# Patient Record
Sex: Male | Born: 1990 | Race: White | Hispanic: No | Marital: Single | Smoking: Never smoker
Health system: Southern US, Community
[De-identification: ages and names within clinical notes are randomized; demographics above are authoritative.]

---
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
## 2012-08-19 ENCOUNTER — Emergency Department: Payer: Self-pay | Admitting: Emergency Medicine

## 2014-10-19 IMAGING — CT CT OF THE RIGHT FOOT WITHOUT CONTRAST
2 series · 14 of 27 positions shown, 18 images · non-contrast
Comparison: none

REASON FOR EXAM: plain film abnormality in navicular region
COMMENTS:

[Series 3: axial · axial · 0.39mm/px · z∈[-1440,-1307]mm · 9 of 159 slices shown, 12 images]
[im 13/159  soft-tissue]
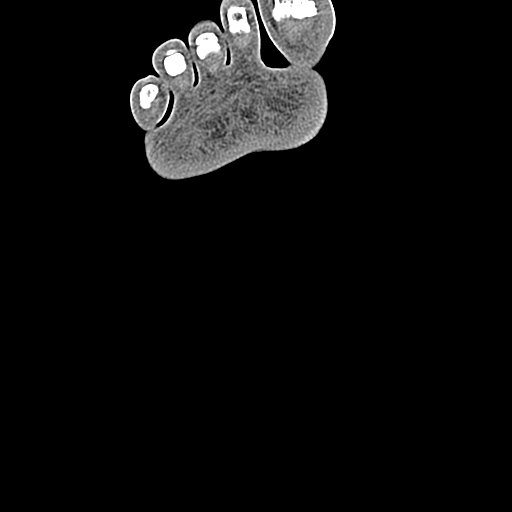
[im 13/159  bone]
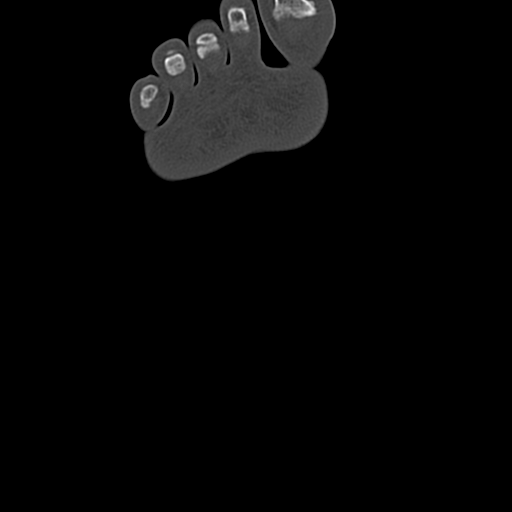
[im 37/159  bone]
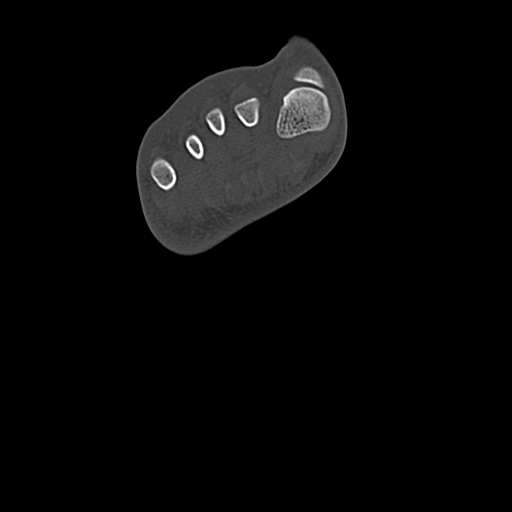
[im 49/159  bone]
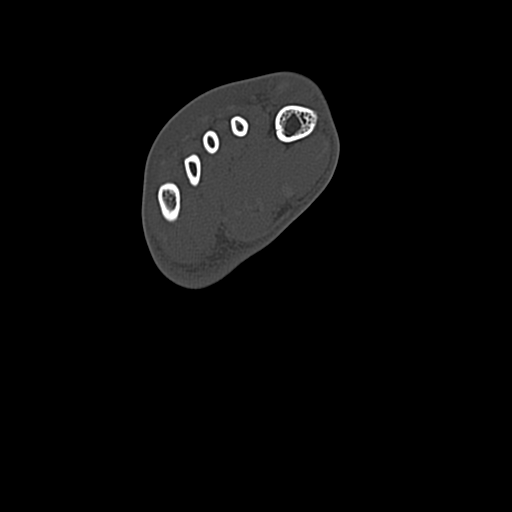
[im 61/159  bone]
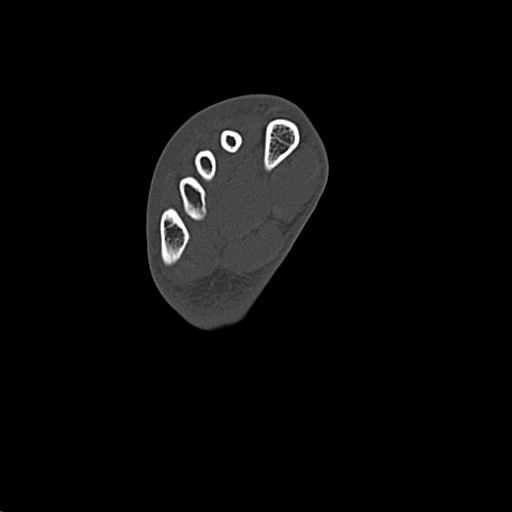
[im 86/159  soft-tissue]
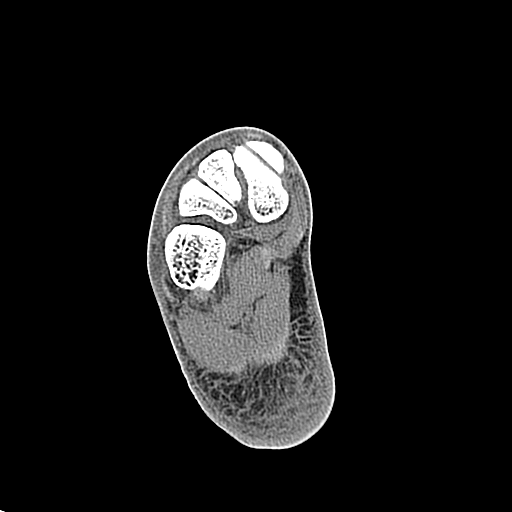
[im 86/159  bone]
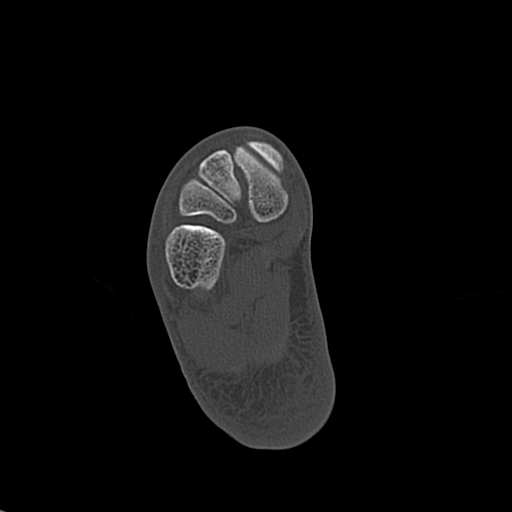
[im 98/159  bone]
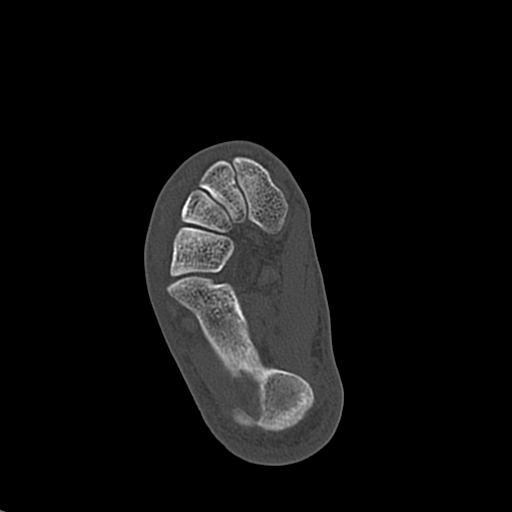
[im 110/159  bone]
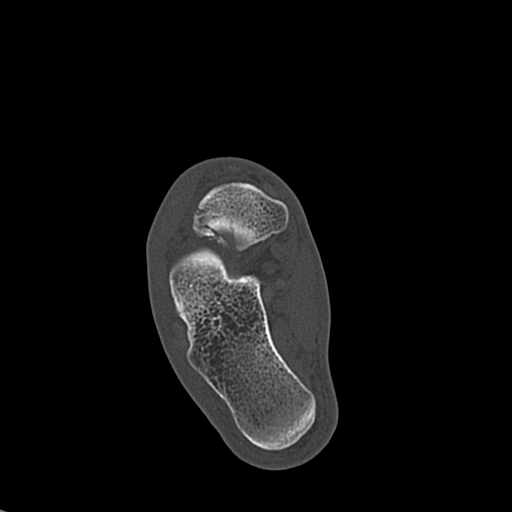
[im 134/159  bone]
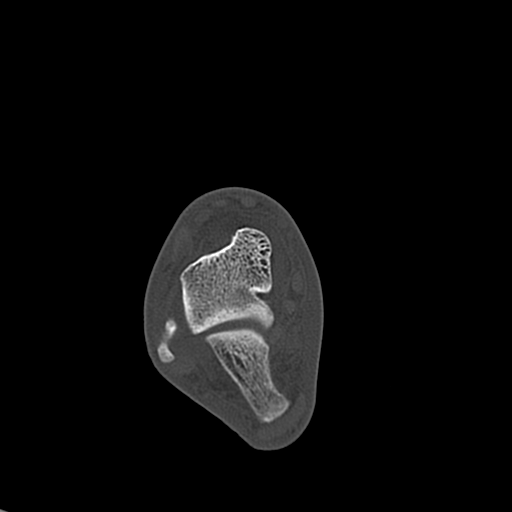
[im 146/159  soft-tissue]
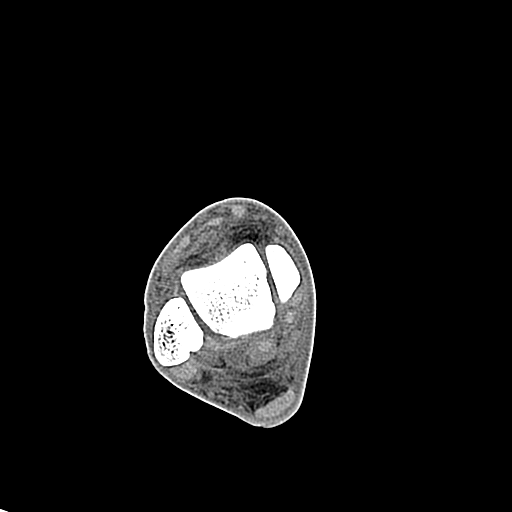
[im 146/159  bone]
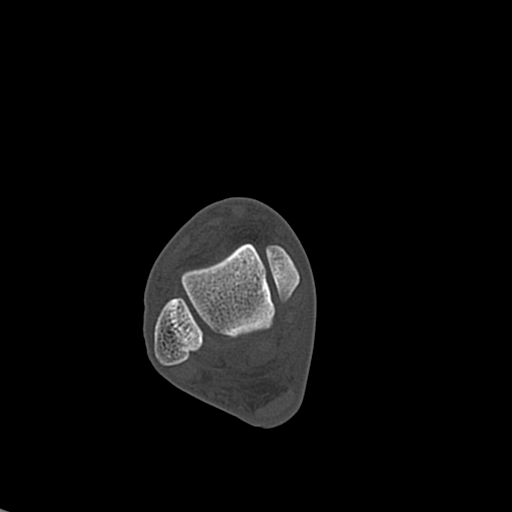

[Series 7: sagittal · sagittal · 0.40mm/px · 5 of 110 slices shown, 6 images]
[im 37/110  bone]
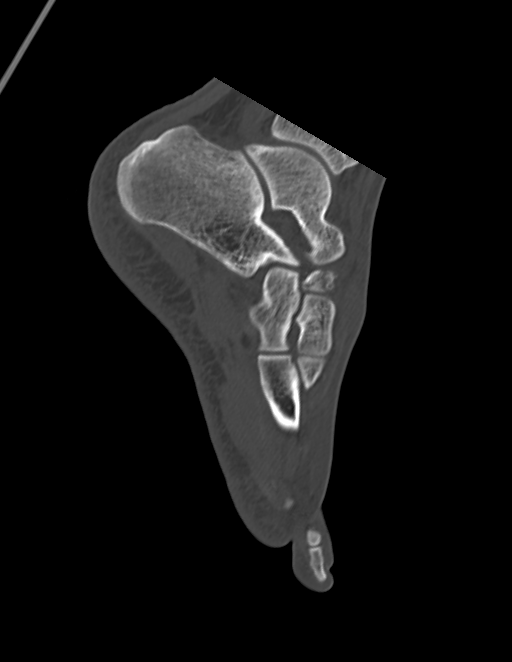
[im 46/110  bone]
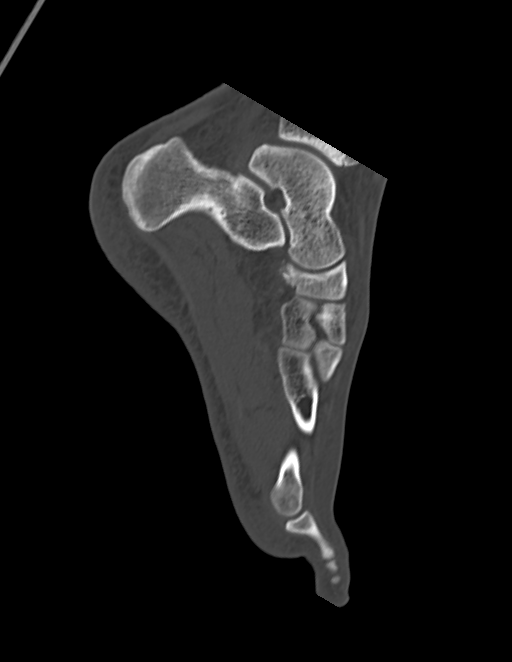
[im 55/110  soft-tissue]
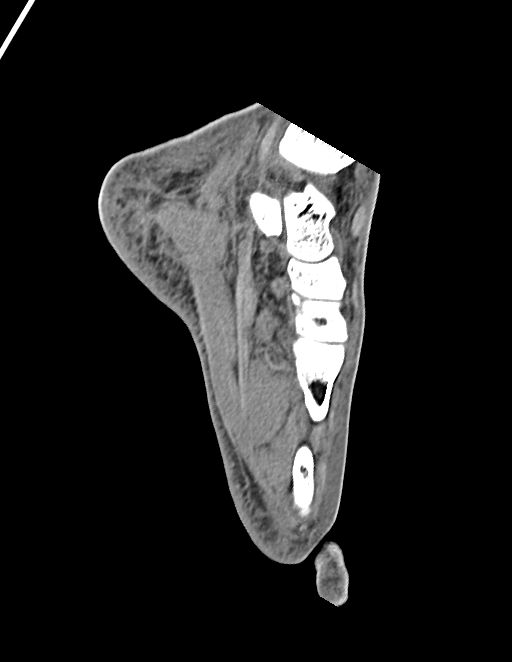
[im 55/110  bone]
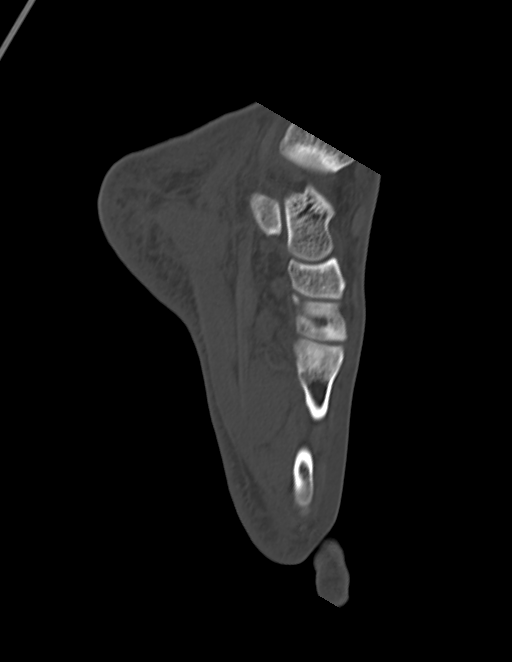
[im 64/110  bone]
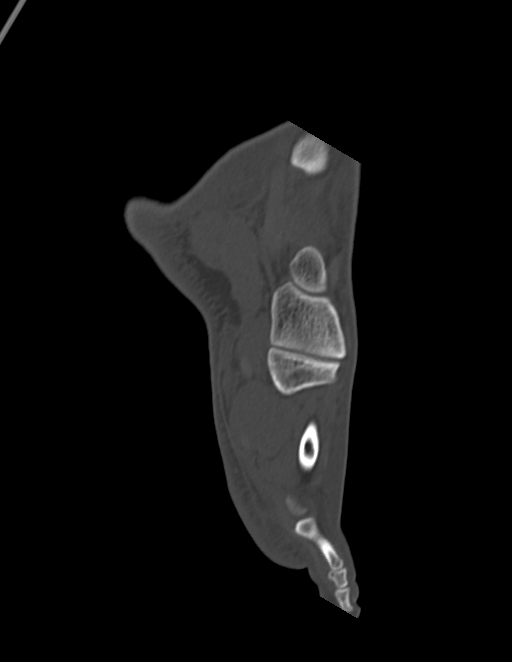
[im 73/110  bone]
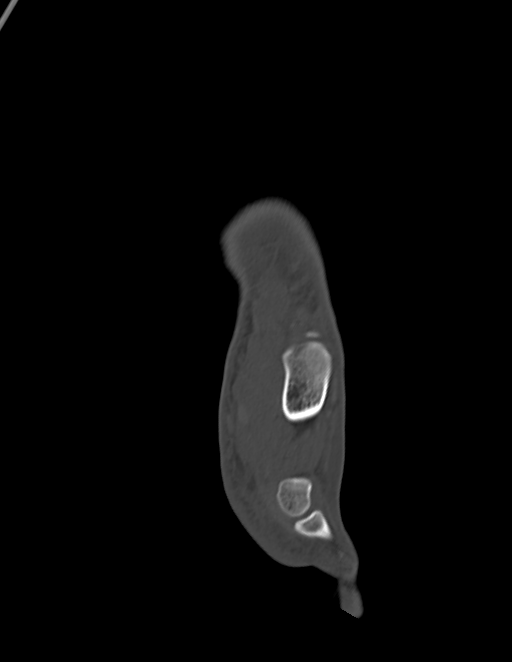

[14 of 27 positions shown; findings below may reference images not displayed]

PROCEDURE:     CT  - CT FOOT RIGHT WITHOUT CONTRAST  - August 19, 2012  [DATE]

RESULT:     Multislice helical acquisition through the right foot
demonstrates fracture in the tarsal navicular and with minimal distraction
at the fracture site. The fracture is present along the lateral aspect
extending to the talonavicular joint space dorsally and inferiorly. No other
fractures are appreciated.
IMPRESSION: 1. Fracture in the lateral aspect of the tarsal navicular. Orthopedic
followup or podiatry followup is recommended.

[REDACTED]

## 2014-10-19 IMAGING — CR RIGHT FOOT COMPLETE - 3+ VIEW
1 series · 3 of 3 positions shown · non-contrast
Comparison: none

REASON FOR EXAM: pain, swelling post trauma
COMMENTS:

[Series 1: x foot ap right · 0.14mm/px · 3 of 3 slices shown]
[im 1/3]
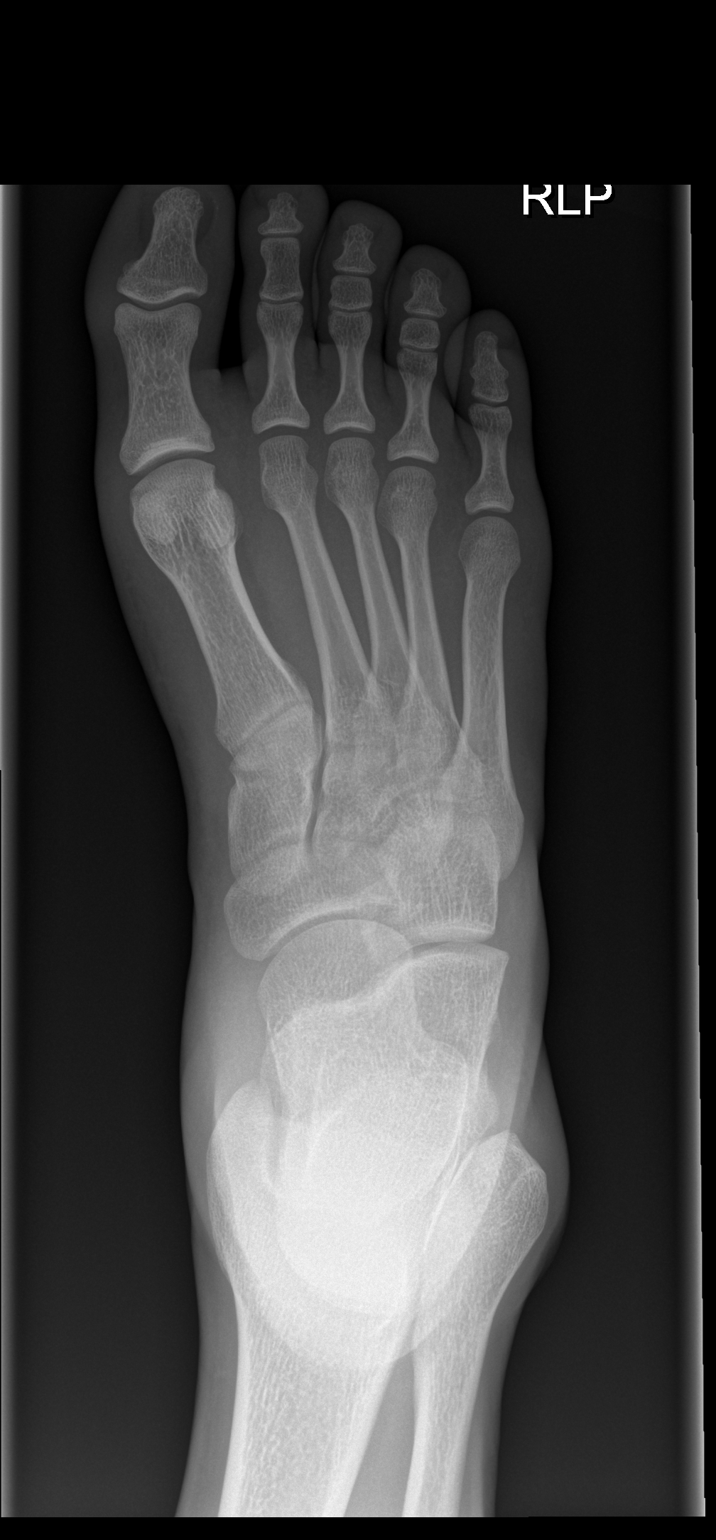
[im 2/3]
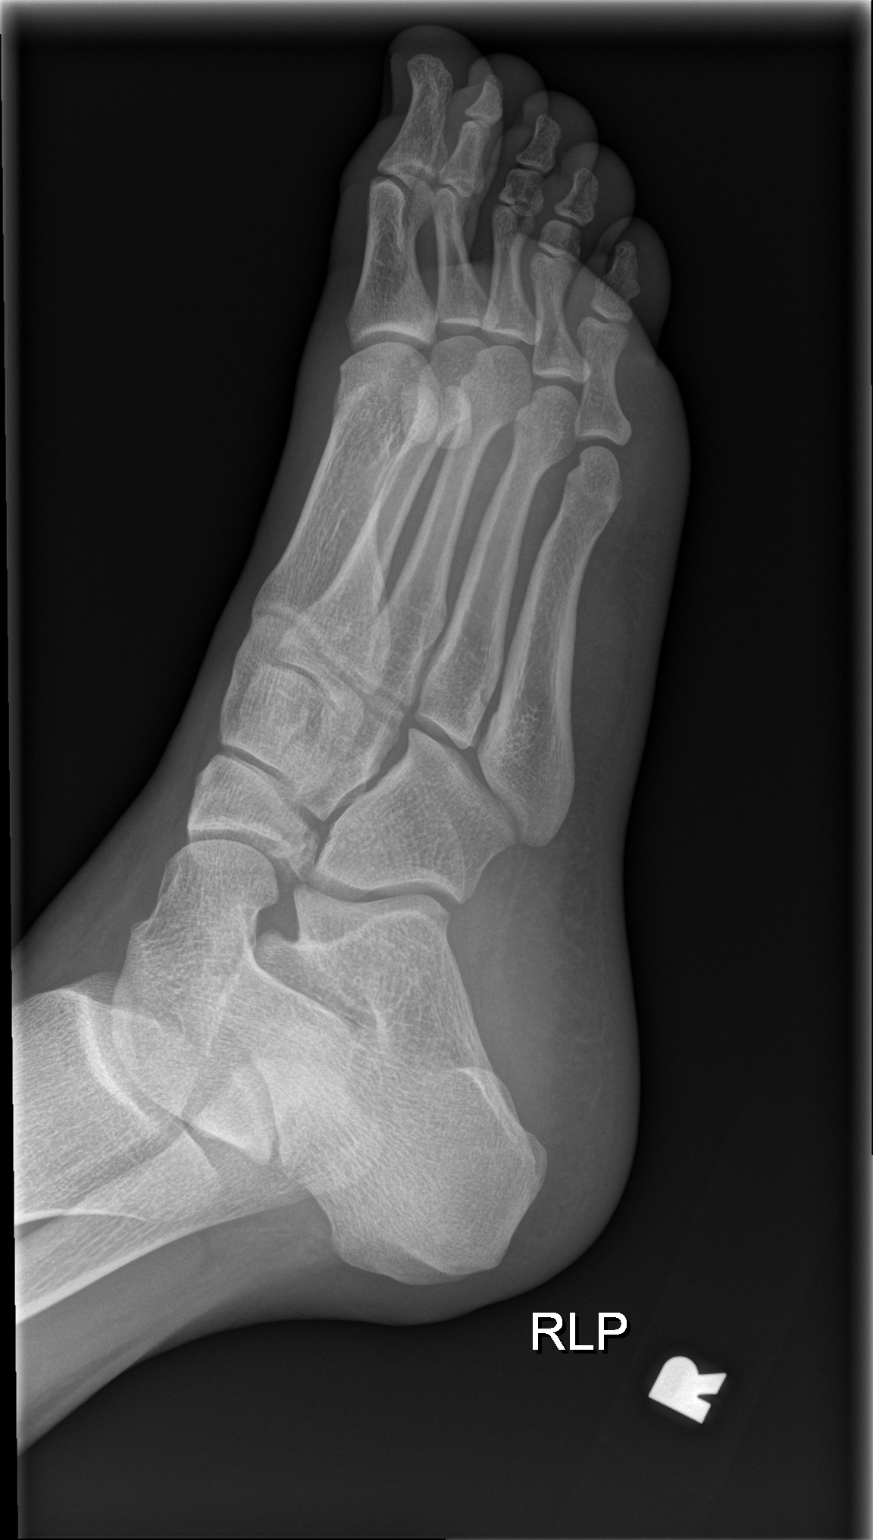
[im 3/3]
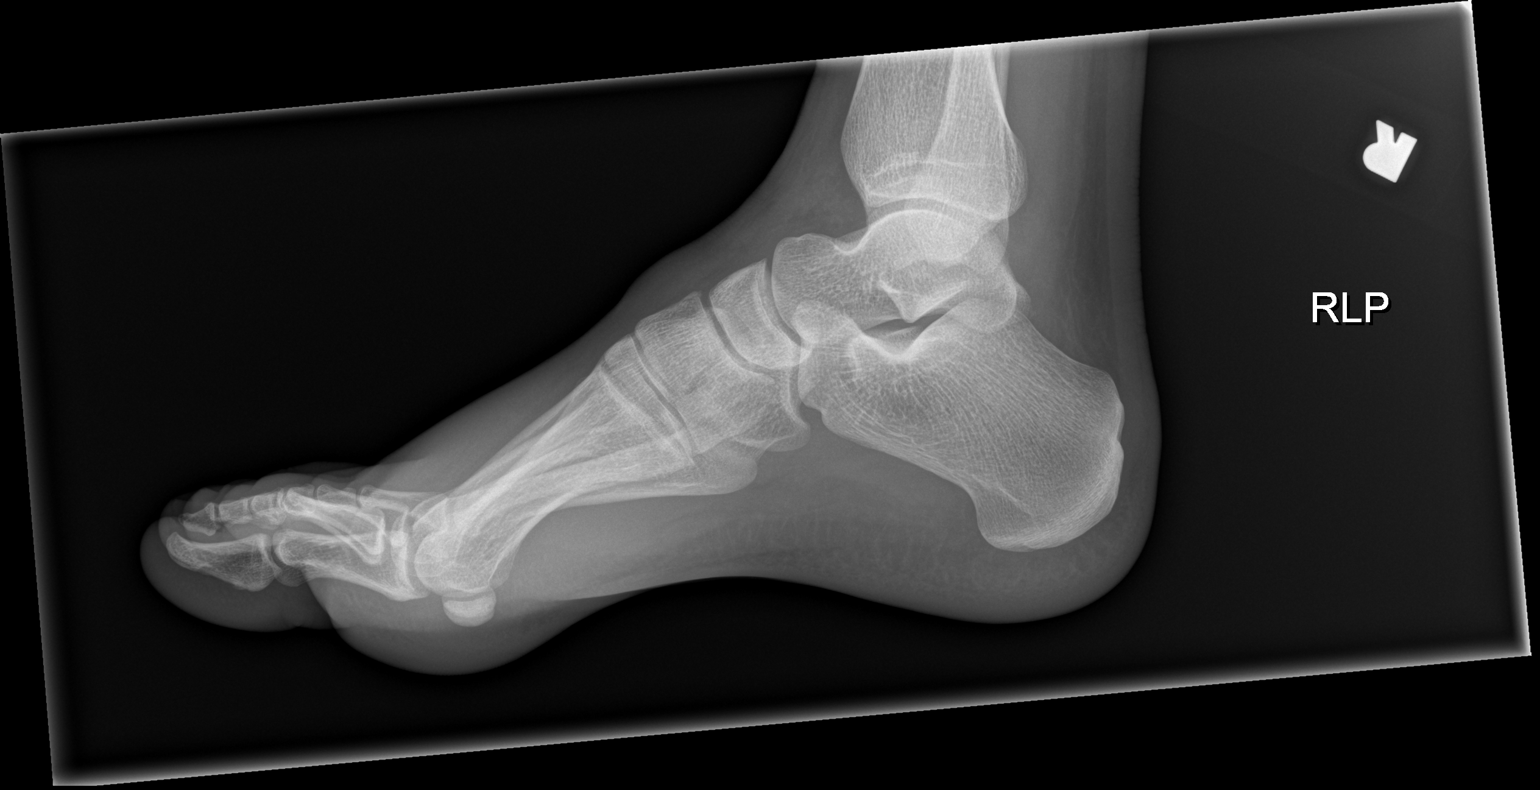

[3 of 3 positions shown; findings below may reference images not displayed]

PROCEDURE:     DXR - DXR FOOT RT COMPLETE W/OBLIQUES  - August 19, 2012 [DATE]

RESULT:     Three views of the right foot reveal the bones to be adequately
mineralized. There is no evidence of an acute phalangeal or metatarsal
fracture. The interphalangeal joints and the metatarsophalangeal joints are
normal in appearance. The metatarsal bases are intact. There is subtle
irregularity of the lateral pole of the tarsal navicular. This could reflect
an acute fracture in the appropriate clinical setting but correlation
clinically will be needed as to specific symptoms over the midfoot.
IMPRESSION: 1. There is no evidence of an acute metatarsal or phalangeal fracture.
2. There is cortical irregularity of the tarsal navicular laterally. It may
be useful to consider the patient for followup CT scanning through the foot
to further assess the navicular.

[REDACTED]

## 2015-05-22 ENCOUNTER — Encounter: Payer: Self-pay | Admitting: Emergency Medicine

## 2015-05-22 ENCOUNTER — Emergency Department
Admission: EM | Admit: 2015-05-22 | Discharge: 2015-05-22 | Disposition: A | Discharge status: Home / Self Care | Payer: 59 | Attending: Emergency Medicine | Admitting: Emergency Medicine

## 2015-05-22 DIAGNOSIS — Y929 Unspecified place or not applicable: Secondary | ICD-10-CM | POA: Insufficient documentation

## 2015-05-22 DIAGNOSIS — S61210A Laceration without foreign body of right index finger without damage to nail, initial encounter: Secondary | ICD-10-CM | POA: Insufficient documentation

## 2015-05-22 DIAGNOSIS — W298XXA Contact with other powered powered hand tools and household machinery, initial encounter: Secondary | ICD-10-CM | POA: Diagnosis not present

## 2015-05-22 DIAGNOSIS — Z23 Encounter for immunization: Secondary | ICD-10-CM | POA: Diagnosis not present

## 2015-05-22 DIAGNOSIS — S61219A Laceration without foreign body of unspecified finger without damage to nail, initial encounter: Secondary | ICD-10-CM

## 2015-05-22 DIAGNOSIS — Y999 Unspecified external cause status: Secondary | ICD-10-CM | POA: Diagnosis not present

## 2015-05-22 DIAGNOSIS — Y939 Activity, unspecified: Secondary | ICD-10-CM | POA: Diagnosis not present

## 2015-05-22 MED ORDER — NAPROXEN 500 MG PO TABS: 500.0000 mg | ORAL_TABLET | Freq: Two times a day (BID) | ORAL | Status: AC

## 2015-05-22 MED ORDER — IBUPROFEN 800 MG PO TABS
800.0000 mg | ORAL_TABLET | Freq: Once | ORAL | Status: AC
  Administered 2015-05-22: 800 mg via ORAL
  Filled 2015-05-22: qty 1

## 2015-05-22 MED ORDER — SULFAMETHOXAZOLE-TRIMETHOPRIM 800-160 MG PO TABS: 1.0000 | ORAL_TABLET | Freq: Two times a day (BID) | ORAL | Status: AC

## 2015-05-22 MED ORDER — TETANUS-DIPHTH-ACELL PERTUSSIS 5-2.5-18.5 LF-MCG/0.5 IM SUSP
0.5000 mL | Freq: Once | INTRAMUSCULAR | Status: AC
  Administered 2015-05-22: 0.5 mL via INTRAMUSCULAR
  Filled 2015-05-22: qty 0.5

## 2015-05-22 MED ORDER — SULFAMETHOXAZOLE-TRIMETHOPRIM 800-160 MG PO TABS
1.0000 | ORAL_TABLET | Freq: Once | ORAL | Status: AC
  Administered 2015-05-22: 1 via ORAL
  Filled 2015-05-22: qty 1

## 2015-05-22 NOTE — Discharge Instructions (Signed)
Laceration Care, Adult  A laceration is a cut that goes through all layers of the skin. The cut also goes into the tissue that is right under the skin. Some cuts heal on their own. Others need to be closed with stitches (sutures), staples, skin adhesive strips, or wound glue. Taking care of your cut lowers your risk of infection and helps your cut to heal better.  HOW TO TAKE CARE OF YOUR CUT  For stitches or staples:  · Keep the wound clean and dry.  · If you were given a bandage (dressing), you should change it at least one time per day or as told by your doctor. You should also change it if it gets wet or dirty.  · Keep the wound completely dry for the first 24 hours or as told by your doctor. After that time, you may take a shower or a bath. However, make sure that the wound is not soaked in water until after the stitches or staples have been removed.  · Clean the wound one time each day or as told by your doctor:    Wash the wound with soap and water.    Rinse the wound with water until all of the soap comes off.    Pat the wound dry with a clean towel. Do not rub the wound.  · After you clean the wound, put a thin layer of antibiotic ointment on it as told by your doctor. This ointment:    Helps to prevent infection.    Keeps the bandage from sticking to the wound.  · Have your stitches or staples removed as told by your doctor.  If your doctor used skin adhesive strips:   · Keep the wound clean and dry.  · If you were given a bandage, you should change it at least one time per day or as told by your doctor. You should also change it if it gets dirty or wet.  · Do not get the skin adhesive strips wet. You can take a shower or a bath, but be careful to keep the wound dry.  · If the wound gets wet, pat it dry with a clean towel. Do not rub the wound.  · Skin adhesive strips fall off on their own. You can trim the strips as the wound heals. Do not remove any strips that are still stuck to the wound. They will  fall off after a while.  If your doctor used wound glue:  · Try to keep your wound dry, but you may briefly wet it in the shower or bath. Do not soak the wound in water, such as by swimming.  · After you take a shower or a bath, gently pat the wound dry with a clean towel. Do not rub the wound.  · Do not do any activities that will make you really sweaty until the skin glue has fallen off on its own.  · Do not apply liquid, cream, or ointment medicine to your wound while the skin glue is still on.  · If you were given a bandage, you should change it at least one time per day or as told by your doctor. You should also change it if it gets dirty or wet.  · If a bandage is placed over the wound, do not let the tape for the bandage touch the skin glue.  · Do not pick at the glue. The skin glue usually stays on for 5-10 days. Then, it   falls off of the skin.  General Instructions   · To help prevent scarring, make sure to cover your wound with sunscreen whenever you are outside after stitches are removed, after adhesive strips are removed, or when wound glue stays in place and the wound is healed. Make sure to wear a sunscreen of at least 30 SPF.  · Take over-the-counter and prescription medicines only as told by your doctor.  · If you were given antibiotic medicine or ointment, take or apply it as told by your doctor. Do not stop using the antibiotic even if your wound is getting better.  · Do not scratch or pick at the wound.  · Keep all follow-up visits as told by your doctor. This is important.  · Check your wound every day for signs of infection. Watch for:    Redness, swelling, or pain.    Fluid, blood, or pus.  · Raise (elevate) the injured area above the level of your heart while you are sitting or lying down, if possible.  GET HELP IF:  · You got a tetanus shot and you have any of these problems at the injection site:    Swelling.    Very bad pain.    Redness.    Bleeding.  · You have a fever.  · A wound that was  closed breaks open.  · You notice a bad smell coming from your wound or your bandage.  · You notice something coming out of the wound, such as wood or glass.  · Medicine does not help your pain.  · You have more redness, swelling, or pain at the site of your wound.  · You have fluid, blood, or pus coming from your wound.  · You notice a change in the color of your skin near your wound.  · You need to change the bandage often because fluid, blood, or pus is coming from the wound.  · You start to have a new rash.  · You start to have numbness around the wound.  GET HELP RIGHT AWAY IF:  · You have very bad swelling around the wound.  · Your pain suddenly gets worse and is very bad.  · You notice painful lumps near the wound or on skin that is anywhere on your body.  · You have a red streak going away from your wound.  · The wound is on your hand or foot and you cannot move a finger or toe like you usually can.  · The wound is on your hand or foot and you notice that your fingers or toes look pale or bluish.     This information is not intended to replace advice given to you by your health care provider. Make sure you discuss any questions you have with your health care provider.     Document Released: 07/08/2007 Document Revised: 06/05/2014 Document Reviewed: 01/15/2014  [employer]vier Interactive Patient Education ©2016 [employer]

## 2015-05-22 NOTE — ED Provider Notes (Signed)
[facility] Emergency Department Provider Note  ____________________________________________  Time seen: Approximately 10:04 PM  I have reviewed the triage vital signs and the nursing notes.   HISTORY  Chief Complaint Laceration    HPI Cody Gray is a 25 y.o. male patient has a laceration to the right index finger. Patient finger was cut by a grinder. Patient say he which is controlled with direct pressure. Patient denies any loss sensation or loss of function of the affected digit. Patient rates pain as a 3/10. Patient did not remember when he had his last tetanus shot.  History reviewed. No pertinent past medical history.  There are no active problems to display for this patient.   History reviewed. No pertinent past surgical history.  Current Outpatient Rx  Name  Route  Sig  Dispense  Refill  . naproxen (NAPROSYN) 500 MG tablet   Oral   Take 1 tablet (500 mg total) by mouth 2 (two) times daily with a meal.   20 tablet   0   . sulfamethoxazole-trimethoprim (BACTRIM DS,SEPTRA DS) 800-160 MG tablet   Oral   Take 1 tablet by mouth 2 (two) times daily.   20 tablet   0     Allergies Review of patient's allergies indicates no known allergies.  No family history on file.  Social History Social History  Substance Use Topics  . Smoking status: Never Smoker   . Smokeless tobacco: None  . Alcohol Use: No    Review of Systems Constitutional: No fever/chills Eyes: No visual changes. ENT: No sore throat. Cardiovascular: Denies chest pain. Respiratory: Denies shortness of breath. Gastrointestinal: No abdominal pain.  No nausea, no vomiting.  No diarrhea.  No constipation. Genitourinary: Negative for dysuria. Musculoskeletal: Negative for back pain. Skin: Negative for rash. Laceration second digit right hand. Neurological: Negative for headaches, focal weakness or numbness.   ____________________________________________   PHYSICAL  EXAM:  VITAL SIGNS: ED Triage Vitals  Enc Vitals Group     BP 05/22/15 2102 130/83 mmHg     Pulse Rate 05/22/15 2102 71     Resp 05/22/15 2102 16     Temp 05/22/15 2102 98.6 F (37 C)     Temp src --      SpO2 05/22/15 2102 100 %     Weight 05/22/15 2102 135 lb (61.236 kg)     Height 05/22/15 2102  (1.753 m)     Head Cir --      Peak Flow --      Pain Score --      Pain Loc --      Pain Edu? --      Excl. in GC? --     Constitutional: Alert and oriented. Well appearing and in no acute distress. Eyes: Conjunctivae are normal. PERRL. EOMI. Head: Atraumatic. Nose: No congestion/rhinnorhea. Mouth/Throat: Mucous membranes are moist.  Oropharynx non-erythematous. Neck: No stridor.  No cervical spine tenderness to palpation. Hematological/Lymphatic/Immunilogical: No cervical lymphadenopathy. Cardiovascular: Normal rate, regular rhythm. Grossly normal heart sounds.  Good peripheral circulation. Respiratory: Normal respiratory effort.  No retractions. Lungs CTAB. Gastrointestinal: Soft and nontender. No distention. No abdominal bruits. No CVA tenderness. Musculoskeletal: No lower extremity tenderness nor edema.  No joint effusions. Neurologic:  Normal speech and language. No gross focal neurologic deficits are appreciated. No gait instability. Skin:  Skin is warm, dry and intact. No rash noted. Psychiatric: Mood and affect are normal. Speech and behavior are normal.  ____________________________________________   LABS (all labs  ordered are listed, but only abnormal results are displayed)  Labs Reviewed - No data to display ____________________________________________  EKG   ____________________________________________  RADIOLOGY   ____________________________________________   PROCEDURES  Procedure(s) performed: LACERATION REPAIR Performed by: Joni Reiningonald K Smith Authorized by: Joni Reiningonald K Smith Consent: Verbal consent obtained. Risks and benefits: risks, benefits  and alternatives were discussed Consent given by: patient Patient identity confirmed: provided demographic data Prepped and Draped in normal sterile fashion Wound explored  Laceration Location: Second digit right hand  Laceration Length: 0.5 cm  No Foreign Bodies seen or palpated  Amount of cleaning: standard  Skin closure: Dermabond  Patient tolerance: Patient tolerated the procedure well with no immediate complications.   Critical Care performed: No  ____________________________________________   INITIAL IMPRESSION / ASSESSMENT AND PLAN / ED COURSE  Pertinent labs & imaging results that were available during my care of the patient were reviewed by me and considered in my medical decision making (see chart for details).  Right index finger laceration. Patient given discharge care instructions. Patient given prescription for Bactrim and naproxen. Patient advised return right ear wound reopens. Patient given a tetanus shot in the ED. ____________________________________________   FINAL CLINICAL IMPRESSION(S) / ED DIAGNOSES  Final diagnoses:  Finger laceration, initial encounter       Joni ReiningRonald K Smith, PA-C 05/22/15 2231  Maurilio LovelyNoelle McLaurin, MD 05/22/15 2326

## 2015-05-22 NOTE — ED Notes (Signed)
Patient ambulatory to triage with steady gait, without difficulty or distress noted; pt reports laceration to right index finger from grinder PTA
